# Patient Record
Sex: Male | Born: 2000 | Race: Black or African American | Hispanic: No | Marital: Single | Smoking: Current every day smoker
Health system: Southern US, Community
[De-identification: ages and names within clinical notes are randomized; demographics above are authoritative.]

---

## 2020-10-04 ENCOUNTER — Emergency Department: Admission: EM | Admit: 2020-10-04 | Discharge: 2020-10-04 | Discharge status: Against Medical Advice | Payer: Self-pay

## 2020-10-04 NOTE — ED Notes (Signed)
Called 1x with no answer

## 2020-10-11 ENCOUNTER — Other Ambulatory Visit: Payer: Self-pay

## 2020-10-11 ENCOUNTER — Emergency Department: Payer: PRIVATE HEALTH INSURANCE

## 2020-10-11 ENCOUNTER — Encounter: Payer: Self-pay | Admitting: Emergency Medicine

## 2020-10-11 DIAGNOSIS — W228XXA Striking against or struck by other objects, initial encounter: Secondary | ICD-10-CM | POA: Insufficient documentation

## 2020-10-11 DIAGNOSIS — S60551A Superficial foreign body of right hand, initial encounter: Secondary | ICD-10-CM | POA: Diagnosis not present

## 2020-10-11 DIAGNOSIS — S6991XA Unspecified injury of right wrist, hand and finger(s), initial encounter: Secondary | ICD-10-CM | POA: Diagnosis present
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
## 2020-10-12 ENCOUNTER — Emergency Department
Admission: EM | Admit: 2020-10-12 | Discharge: 2020-10-12 | Disposition: A | Discharge status: Home / Self Care | Payer: PRIVATE HEALTH INSURANCE | Attending: Emergency Medicine | Admitting: Emergency Medicine

## 2020-10-12 ENCOUNTER — Emergency Department: Payer: PRIVATE HEALTH INSURANCE

## 2020-10-12 DIAGNOSIS — M79641 Pain in right hand: Secondary | ICD-10-CM

## 2020-10-12 MED ORDER — LIDOCAINE-EPINEPHRINE 2 %-1:100000 IJ SOLN
20.0000 mL | Freq: Once | INTRAMUSCULAR | Status: AC
  Administered 2020-10-12: 20 mL via INTRADERMAL
  Filled 2020-10-12: qty 1

## 2020-10-12 MED ORDER — AMOXICILLIN-POT CLAVULANATE 875-125 MG PO TABS: 1.0000 | ORAL_TABLET | Freq: Two times a day (BID) | ORAL | 0 refills | Status: AC

## 2020-10-12 NOTE — ED Notes (Signed)
Pt alert and oriented X 4, stable for discharge. RR even and unlabored, color WNL. Discussed discharge instructions and follow-up as directed. Discharge medications discussed if prescribed. Pt had opportunity to ask questions, and RN to provide patient/family eduction.  

## 2020-10-12 NOTE — ED Provider Notes (Signed)
[facility] Emergency Department Provider Note  ____________________________________________   None    (approximate)  I have reviewed the triage vital signs and the nursing notes.   HISTORY  Chief Complaint Hand Injury   HPI Garrett Evans is a 20 y.o. male without significant past medical history who presents for assessment of some pain redness and a little bit of bleeding from a laceration he sustained to the back of his right hand at the base of the fifth digit 3 days ago when he excellently punched a window.  Patient denies any other injuries and states he wants to be checked out because he noticed some blood coming from his cut.  He has not had any spreading of redness or increased swelling or pain.  He denies any other injuries including in his forearm or other extremities.  He is otherwise been in his usual state of health without any recent other sick symptoms including fevers, chills, cough, nausea, vomiting, diarrhea, dysuria, rash or other acute sick symptoms.  He states his last tetanus shot was within the past year.         History reviewed. No pertinent past medical history.  There are no problems to display for this patient.   History reviewed. No pertinent surgical history.  Prior to Admission medications   Medication Sig Start Date End Date Taking? Authorizing Provider  amoxicillin-clavulanate (AUGMENTIN) 875-125 MG tablet Take 1 tablet by mouth 2 (two) times daily for 7 days. 10/12/20 10/19/20 Yes Gilles Chiquito, MD    Allergies Patient has no known allergies.  No family history on file.  Social History    Review of Systems  Review of Systems  Constitutional: Negative for chills and fever.  HENT: Negative for sore throat.   Eyes: Negative for pain.  Respiratory: Negative for cough and stridor.   Cardiovascular: Negative for chest pain.  Gastrointestinal: Negative for vomiting.  Genitourinary: Negative for dysuria.   Musculoskeletal: Positive for myalgias ( R hand).  Skin: Negative for rash.  Neurological: Negative for seizures, loss of consciousness and headaches.  Psychiatric/Behavioral: Negative for suicidal ideas.  All other systems reviewed and are negative.     ____________________________________________   PHYSICAL EXAM:  VITAL SIGNS: ED Triage Vitals  Enc Vitals Group     BP 10/11/20 2340 127/67     Pulse Rate 10/11/20 2340 60     Resp 10/11/20 2340 16     Temp 10/11/20 2340 98 F (36.7 C)     Temp Source 10/11/20 2340 Oral     SpO2 10/11/20 2340 99 %     Weight 10/11/20 2307 190 lb (86.2 kg)     Height 10/11/20 2307 5\' 11"  (1.803 m)     Head Circumference --      Peak Flow --      Pain Score 10/11/20 2307 10     Pain Loc --      Pain Edu? --      Excl. in GC? --    Vitals:   10/11/20 2340  BP: 127/67  Pulse: 60  Resp: 16  Temp: 98 F (36.7 C)  SpO2: 99%   Physical Exam Vitals and nursing note reviewed.  Constitutional:      Appearance: He is well-developed and well-nourished.  HENT:     Head: Normocephalic and atraumatic.     Right Ear: External ear normal.     Left Ear: External ear normal.     Nose: Nose normal.  Eyes:  Conjunctiva/sclera: Conjunctivae normal.  Cardiovascular:     Rate and Rhythm: Normal rate and regular rhythm.     Heart sounds: No murmur heard.   Pulmonary:     Effort: Pulmonary effort is normal. No respiratory distress.     Breath sounds: Normal breath sounds.  Abdominal:     Palpations: Abdomen is soft.     Tenderness: There is no abdominal tenderness.  Musculoskeletal:        General: No edema.     Cervical back: Neck supple.  Skin:    General: Skin is warm and dry.     Capillary Refill: Capillary refill takes less than 2 seconds.  Neurological:     Mental Status: He is alert and oriented to person, place, and time.  Psychiatric:        Mood and Affect: Mood and affect and mood normal.     2+ radial pulse.  Sensation  is intact in the distribution of the ulnar radial and median nerves in the right hand.  Patient is able to flex and extend all digits of the right hand with full strength.  He does have a scab over a approximately 2 cm linear laceration over the dorsum of the right hand at the base of the fifth digit.  There is also a small abrasion over the ulnar aspect of the wrist without any significant tenderness.  No snuffbox tenderness. ____________________________________________    ____________________________________________  RADIOLOGY  ED MD interpretation: No acute fracture or dislocation.  Retained glass fragment noted at the base of the right fifth metacarpal.  Official radiology report(s): DG Hand 2 View Right  Result Date: 10/12/2020 CLINICAL DATA:  Status post foreign body retrieval EXAM: RIGHT HAND - 2 VIEW COMPARISON:  10/11/2020 FINDINGS: Since the prior examination, there has been no change. Two radiopaque angular foreign bodies are seen within the soft tissues dorsal lateral to the right fifth metacarpophalangeal joint in keeping with retained radiopaque foreign bodies. No other changes are identified. IMPRESSION: Stable retained radiopaque foreign bodies within the soft tissues of the dorsal lateral right hand. Electronically Signed   By: Fidela Salisbury MD   On: 10/12/2020 05:03   DG Hand Complete Right  Result Date: 10/11/2020 CLINICAL DATA:  Punched through glass.  Right hand laceration EXAM: RIGHT HAND - COMPLETE 3+ VIEW COMPARISON:  None. FINDINGS: z three view radiograph right hand demonstrates no acute fracture or dislocationl. Normal alignment. There are two radiopaque foreign bodies within the a soft tissues dorsal lateral to the right fifth MCP joint compatible with retained fragments of glass given the history. These measure 5 mm and 2 mm in greatest dimension. Mild associated soft tissue swelling is present. IMPRESSION: Two retained radiopaque foreign bodies compatible with glass  fragments measuring 2 mm and 5 mm dorsal lateral to the right fifth MCP. Electronically Signed   By: Fidela Salisbury MD   On: 10/11/2020 23:26    ____________________________________________   PROCEDURES  Procedure(s) performed (including Critical Care):  .Foreign Body Removal  Date/Time: 10/12/2020 5:25 AM Performed by: Lucrezia Starch, MD Authorized by: Lucrezia Starch, MD  Consent: Verbal consent obtained. Consent given by: patient Patient understanding: patient states understanding of the procedure being performed Patient identity confirmed: verbally with patient Body area: skin General location: upper extremity Location details: right hand Anesthesia: local infiltration  Anesthesia: Local Anesthetic: lidocaine 1% with epinephrine Anesthetic total: 3 mL  Sedation: Patient sedated: no  Patient restrained: no Tendon involvement: none Complexity: simple 1  objects recovered. Patient tolerance: patient tolerated the procedure well with no immediate complications     ____________________________________________   INITIAL IMPRESSION / ASSESSMENT AND PLAN / ED COURSE      Patient presents for assessment of laceration to the back of his right hand he sustained after he punched a window 3 days ago.  On arrival he is afebrile hemodynamically stable.  He is neurovascular intact throughout his right hand.  No fracture dislocation on x-ray but there is retained glass fragment.  No snuffbox tenderness to suggest an occult scaphoid injury.  Tetanus is already up-to-date.  Wound was extensively irrigated and explored throughout its full range initially.  Repeat x-ray obtained as I was initially unsure if glass had come out during irrigation but showed retained glass fragment.  This was removed and I was able to extract a jagged shaped fragment which maxed fragment and repeat x-ray.  No other foreign body seen on x-ray and of low suspicion for non-radiopaque foreign bodies.  No  evidence of cellulitis.  However wound is very dirty and was explored fairly deep so we will write short course of antibiotics to decrease risk of infection.  Patient discharged stable condition.  Strict return precautions advised and discussed.          ____________________________________________   FINAL CLINICAL IMPRESSION(S) / ED DIAGNOSES  Final diagnoses:  Right hand pain    Medications  lidocaine-EPINEPHrine (XYLOCAINE W/EPI) 2 %-1:100000 (with pres) injection 20 mL (20 mLs Intradermal Given by Other 10/12/20 9629)     ED Discharge Orders         Ordered    amoxicillin-clavulanate (AUGMENTIN) 875-125 MG tablet  2 times daily        10/12/20 0521           Note:  This document was prepared using Dragon voice recognition software and may include unintentional dictation errors.   Gilles Chiquito, MD 10/12/20 [phone]

## 2020-11-04 ENCOUNTER — Other Ambulatory Visit: Payer: Self-pay

## 2020-11-04 ENCOUNTER — Encounter: Payer: Self-pay | Admitting: Emergency Medicine

## 2020-11-04 ENCOUNTER — Emergency Department
Admission: EM | Admit: 2020-11-04 | Discharge: 2020-11-04 | Disposition: A | Discharge status: Home / Self Care | Payer: PRIVATE HEALTH INSURANCE | Attending: Emergency Medicine | Admitting: Emergency Medicine

## 2020-11-04 DIAGNOSIS — R21 Rash and other nonspecific skin eruption: Secondary | ICD-10-CM | POA: Diagnosis not present

## 2020-11-04 DIAGNOSIS — N4829 Other inflammatory disorders of penis: Secondary | ICD-10-CM | POA: Insufficient documentation

## 2020-11-04 DIAGNOSIS — Z79899 Other long term (current) drug therapy: Secondary | ICD-10-CM | POA: Insufficient documentation

## 2020-11-04 DIAGNOSIS — F17210 Nicotine dependence, cigarettes, uncomplicated: Secondary | ICD-10-CM | POA: Insufficient documentation

## 2020-11-04 MED ORDER — TRIAMCINOLONE ACETONIDE 0.1 % EX CREA: TOPICAL_CREAM | CUTANEOUS | 0 refills | Status: AC

## 2020-11-04 NOTE — ED Provider Notes (Signed)
[facility] Emergency Department Provider Note ____________________________________________   Event Date/Time   First MD Initiated Contact with Patient 11/04/20 [phone]     (approximate)  I have reviewed the triage vital signs and the nursing notes.   HISTORY  Chief Complaint Other  HPI Garrett Evans is a 20 y.o. male presents to the ED with complaint of bumps on his penis that was noticed last night while he was in the bathroom.  Patient denies any penile discharge, no pain, no itching, and no redness.  Patient has 1 partner and does not use condoms.  He denies any urinary symptoms.  Currently rates pain as 0/10.       History reviewed. No pertinent past medical history.  There are no problems to display for this patient.   History reviewed. No pertinent surgical history.  Prior to Admission medications   Medication Sig Start Date End Date Taking? Authorizing Provider  triamcinolone (KENALOG) 0.1 % Apply very thin coat of cream to bumps twice a day 11/04/20  Yes Tommi Rumps, PA-C    Allergies Patient has no known allergies.  No family history on file.  Social History Social History   Tobacco Use  . Smoking status: Current Every Day Smoker  . Smokeless tobacco: Never Used  Substance Use Topics  . Alcohol use: Never  . Drug use: Never    Review of Systems Constitutional: No fever/chills Eyes: No visual changes. Cardiovascular: Denies chest pain. Respiratory: Denies shortness of breath. Gastrointestinal:  No nausea, no vomiting.  Genitourinary: Negative for dysuria.  Negative for  penile discharge Musculoskeletal: Negative for back pain. Skin: Positive for lesions on penis. Neurological: Negative for headaches, focal weakness or numbness. ____________________________________________   PHYSICAL EXAM:  VITAL SIGNS: ED Triage Vitals  Enc Vitals Group     BP 11/04/20 0405 127/77     Pulse Rate 11/04/20 0405 62     Resp  11/04/20 0405 20     Temp 11/04/20 0405 98 F (36.7 C)     Temp Source 11/04/20 0405 Oral     SpO2 11/04/20 0405 100 %     Weight 11/04/20 0412 190 lb (86.2 kg)     Height 11/04/20 0412 5\' 11"  (1.803 m)     Head Circumference --      Peak Flow --      Pain Score 11/04/20 0412 0     Pain Loc --      Pain Edu? --      Excl. in GC? --     Constitutional: Alert and oriented. Well appearing and in no acute distress. Eyes: Conjunctivae are normal.  Head: Atraumatic. Neck: No stridor.   Cardiovascular: Normal rate, regular rhythm. Grossly normal heart sounds.  Good peripheral circulation. Respiratory: Normal respiratory effort.  No retractions. Lungs CTAB. Gastrointestinal: Soft and nontender. No distention. No abdominal bruits. No CVA tenderness. Musculoskeletal: No lower extremity tenderness nor edema.  No joint effusions. Neurologic:  Normal speech and language. No gross focal neurologic deficits are appreciated. No gait instability. Skin:  Skin is warm, dry and intact.  On the shaft of the penis there are 3 papules.  No involvement of the glans.  These are papules and not vesicles.  There is no cluster or erythema at the base.  Area is nontender to touch.  No penile discharge is noted. Psychiatric: Mood and affect are normal. Speech and behavior are normal.  ____________________________________________   LABS (all labs ordered are listed, but only  abnormal results are displayed)  Labs Reviewed - No data to display   PROCEDURES  Procedure(s) performed (including Critical Care):  Procedures   ____________________________________________   INITIAL IMPRESSION / ASSESSMENT AND PLAN / ED COURSE  As part of my medical decision making, I reviewed the following data within the electronic MEDICAL RECORD NUMBER Notes from prior ED visits and [place] Controlled Substance Database  20 year old male presents to the ED with complaint of bumps on his penis that he noticed when going to the  bathroom during the night.  Patient denies any pain or penile discharge.  Patient does not wear condoms.  He currently has 1 partner.  Area does not appear erythematous or with vesicles.  There are 3 papules noted on the shaft that are nontender.  Patient was made aware that most likely this is not herpes.  He is to watch these areas and if any further testing is needed he is encouraged for he and his partner to go to the health department.  Areas do not appear to be venereal warts at this time as well.  ____________________________________________   FINAL CLINICAL IMPRESSION(S) / ED DIAGNOSES  Final diagnoses:  Rash/skin eruption     ED Discharge Orders         Ordered    triamcinolone (KENALOG) 0.1 %        11/04/20 0803          *Please note:  Garrett Evans was evaluated in Emergency Department on 11/04/2020 for the symptoms described in the history of present illness. He was evaluated in the context of the global COVID-19 pandemic, which necessitated consideration that the patient might be at risk for infection with the SARS-CoV-2 virus that causes COVID-19. Institutional protocols and algorithms that pertain to the evaluation of patients at risk for COVID-19 are in a state of rapid change based on information released by regulatory bodies including the CDC and federal and state organizations. These policies and algorithms were followed during the patient's care in the ED.  Some ED evaluations and interventions may be delayed as a result of limited staffing during and the pandemic.*   Note:  This document was prepared using Dragon voice recognition software and may include unintentional dictation errors.   Tommi Rumps, PA-C 11/04/20 1630    Gilles Chiquito, MD 11/04/20 2037

## 2020-11-04 NOTE — ED Notes (Signed)
Pt verbalizes understanding of d/c instructions, medications and follow up 

## 2020-11-04 NOTE — Discharge Instructions (Signed)
Follow up with Health Department if any continued problems.  Use a very thin application of cream to the area twice a day.  Read information about herpes and watch for any continued break outs. You and your partner can be screened and treated for free if needed.

## 2020-11-04 NOTE — ED Triage Notes (Signed)
Patient states that when he went to the bathroom he noticed that he has bumps on his penis.

## 2020-12-27 ENCOUNTER — Other Ambulatory Visit: Payer: Self-pay

## 2020-12-27 ENCOUNTER — Emergency Department
Admission: EM | Admit: 2020-12-27 | Discharge: 2020-12-27 | Disposition: A | Discharge status: Home / Self Care | Payer: PRIVATE HEALTH INSURANCE | Attending: Emergency Medicine | Admitting: Emergency Medicine

## 2020-12-27 ENCOUNTER — Emergency Department: Payer: PRIVATE HEALTH INSURANCE

## 2020-12-27 DIAGNOSIS — S20212A Contusion of left front wall of thorax, initial encounter: Secondary | ICD-10-CM | POA: Insufficient documentation

## 2020-12-27 DIAGNOSIS — S299XXA Unspecified injury of thorax, initial encounter: Secondary | ICD-10-CM | POA: Diagnosis present

## 2020-12-27 DIAGNOSIS — Y92410 Unspecified street and highway as the place of occurrence of the external cause: Secondary | ICD-10-CM | POA: Diagnosis not present

## 2020-12-27 DIAGNOSIS — F17200 Nicotine dependence, unspecified, uncomplicated: Secondary | ICD-10-CM | POA: Insufficient documentation

## 2020-12-27 MED ORDER — NAPROXEN 500 MG PO TABS: 500.0000 mg | ORAL_TABLET | Freq: Two times a day (BID) | ORAL | Status: AC

## 2020-12-27 MED ORDER — ORPHENADRINE CITRATE ER 100 MG PO TB12: 100.0000 mg | ORAL_TABLET | Freq: Two times a day (BID) | ORAL | 0 refills | Status: AC

## 2020-12-27 NOTE — Discharge Instructions (Signed)
Your x-ray did not show any obvious fracture of the left lateral ribs.  Follow discharge care instructions and take medication as directed.  Be advised muscle relaxers may cause drowsiness.
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
## 2021-01-03 ENCOUNTER — Telehealth: Payer: Self-pay

## 2021-01-03 NOTE — Telephone Encounter (Signed)
After receiving an ER referral, called pt. He is not interested in becoming a pt here at this time.

## 2021-11-12 ENCOUNTER — Encounter: Payer: Self-pay | Admitting: Emergency Medicine

## 2021-11-12 ENCOUNTER — Other Ambulatory Visit: Payer: Self-pay

## 2021-11-12 ENCOUNTER — Emergency Department: Payer: PRIVATE HEALTH INSURANCE

## 2021-11-12 ENCOUNTER — Emergency Department
Admission: EM | Admit: 2021-11-12 | Discharge: 2021-11-12 | Disposition: A | Discharge status: Home / Self Care | Payer: PRIVATE HEALTH INSURANCE | Attending: Emergency Medicine | Admitting: Emergency Medicine

## 2021-11-12 DIAGNOSIS — R109 Unspecified abdominal pain: Secondary | ICD-10-CM | POA: Diagnosis not present

## 2021-11-12 DIAGNOSIS — R10A0 Flank pain, unspecified side: Secondary | ICD-10-CM

## 2021-11-12 DIAGNOSIS — M545 Low back pain, unspecified: Secondary | ICD-10-CM | POA: Diagnosis not present

## 2021-11-12 DIAGNOSIS — R319 Hematuria, unspecified: Secondary | ICD-10-CM

## 2021-11-12 LAB — CHLAMYDIA/NGC RT PCR (ARMC ONLY)
Chlamydia Tr: NOT DETECTED
N gonorrhoeae: NOT DETECTED

## 2021-11-12 LAB — URINALYSIS, ROUTINE W REFLEX MICROSCOPIC
Bacteria, UA: NONE SEEN
Bilirubin Urine: NEGATIVE
Glucose, UA: NEGATIVE mg/dL
Ketones, ur: NEGATIVE mg/dL
Leukocytes,Ua: NEGATIVE
Nitrite: NEGATIVE
Protein, ur: NEGATIVE mg/dL
Specific Gravity, Urine: 1.016 (ref 1.005–1.030)
Squamous Epithelial / HPF: NONE SEEN (ref 0–5)
pH: 7 (ref 5.0–8.0)

## 2021-11-12 LAB — BASIC METABOLIC PANEL
Anion gap: 7 (ref 5–15)
BUN: 9 mg/dL (ref 6–20)
CO2: 26 mmol/L (ref 22–32)
Calcium: 9.4 mg/dL (ref 8.9–10.3)
Chloride: 104 mmol/L (ref 98–111)
Creatinine, Ser: 0.81 mg/dL (ref 0.61–1.24)
GFR, Estimated: >60 mL/min (ref >60)
Glucose, Bld: 96 mg/dL (ref 70–99)
Potassium: 4.2 mmol/L (ref 3.5–5.1)
Sodium: 137 mmol/L (ref 135–145)

## 2021-11-12 LAB — CBC
HCT: 41.6 % (ref 39.0–52.0)
Hemoglobin: 13.9 g/dL (ref 13.0–17.0)
MCH: 27.7 pg (ref 26.0–34.0)
MCHC: 33.4 g/dL (ref 30.0–36.0)
MCV: 83 fL (ref 80.0–100.0)
Platelets: 204 10*3/uL (ref 150–400)
RBC: 5.01 MIL/uL (ref 4.22–5.81)
RDW: 12.9 % (ref 11.5–15.5)
WBC: 5.8 10*3/uL (ref 4.0–10.5)
nRBC: 0 % (ref 0.0–0.2)

## 2021-11-12 LAB — LIPASE, BLOOD: Lipase: 31 U/L (ref 11–51)

## 2021-11-12 MED ORDER — KETOROLAC TROMETHAMINE 30 MG/ML IJ SOLN
15.0000 mg | Freq: Once | INTRAMUSCULAR | Status: AC
  Administered 2021-11-12: 15 mg via INTRAVENOUS
  Filled 2021-11-12: qty 1

## 2021-11-12 MED ORDER — LACTATED RINGERS IV BOLUS
1000.0000 mL | Freq: Once | INTRAVENOUS | Status: AC
Start: 2021-11-12 — End: 2021-11-12
  Administered 2021-11-12: 1000 mL via INTRAVENOUS

## 2021-11-12 MED ORDER — ACETAMINOPHEN 500 MG PO TABS
1000.0000 mg | ORAL_TABLET | Freq: Once | ORAL | Status: AC
  Administered 2021-11-12: 1000 mg via ORAL
  Filled 2021-11-12: qty 2

## 2021-11-12 NOTE — ED Triage Notes (Signed)
Pt comes into the ED via POV c/o left flank pain and blood in his urine.  Pt states he didn't notice it until this morning.  Pt denies any h/o kidney stones.  Pt in NAd at this time with even and unlabored respirations.

## 2021-11-12 NOTE — ED Provider Notes (Signed)
[facility] Provider Note    Event Date/Time   First MD Initiated Contact with Patient 11/12/21 [phone]     (approximate)   History   Flank Pain   HPI  Garrett Evans is a 21 y.o. male without significant past medical history who presents for assessment of some left-sided low back pain rating around the left flank associate with gross hematuria that started this morning.  He states the pain woke him from sleep.  He states it was a 6 out of 10 when it began but is now 4 out of 10 intensity.  He denies any burning with urination or current abdominal pain.  Denies any fevers, vomiting, diarrhea, nausea, chest pain, cough, shortness of breath, headache, earache, sore throat, rash or extremity pain.  Denies tobacco use or illicit drug use.  He is not typically daily drinker but recently was out of town for couple days drink excessively over the last couple days.  He has no other acute concerns at this time      Physical Exam  Triage Vital Signs: ED Triage Vitals  Enc Vitals Group     BP 11/12/21 0858 133/86     Pulse Rate 11/12/21 0858 66     Resp 11/12/21 0858 18     Temp 11/12/21 0858 98.9 F (37.2 C)     Temp src --      SpO2 11/12/21 0858 97 %     Weight 11/12/21 0854 190 lb 0.6 oz (86.2 kg)     Height 11/12/21 0854 5\' 11"  (1.803 m)     Head Circumference --      Peak Flow --      Pain Score 11/12/21 0854 6     Pain Loc --      Pain Edu? --      Excl. in [place]? --     Most recent vital signs: Vitals:   11/12/21 0858  BP: 133/86  Pulse: 66  Resp: 18  Temp: 98.9 F (37.2 C)  SpO2: 97%    General: Awake, no distress.  CV:  Good peripheral perfusion.  Resp:  Normal effort.  Abd:  No distention.  Other:  No CVA tenderness overlying skin changes over the back.   ED Results / Procedures / Treatments  Labs (all labs ordered are listed, but only abnormal results are displayed) Labs Reviewed  URINALYSIS, ROUTINE W REFLEX MICROSCOPIC - Abnormal;  Notable for the following components:      Result Value   Color, Urine YELLOW (*)    APPearance CLEAR (*)    Hgb urine dipstick SMALL (*)    All other components within normal limits  CHLAMYDIA/NGC RT PCR ([facility] ONLY)            BASIC METABOLIC PANEL  CBC  LIPASE, BLOOD     EKG    RADIOLOGY  CT abdomen pelvis interpreted by myself shows no evidence of perinephric stranding or kidney stone or ureteral dilation.  No other clear acute process.  Also reviewed radiology interpretation and agree with her findings of no acute abdominal pelvic process.  Specifically no evidence of diverticulitis, kidney stone, pancreatitis, or pyelonephritis.   PROCEDURES:  Critical Care performed: No  Procedures    MEDICATIONS ORDERED IN ED: Medications  lactated ringers bolus 1,000 mL (1,000 mLs Intravenous New Bag/Given 11/12/21 0922)  acetaminophen (TYLENOL) tablet 1,000 mg (1,000 mg Oral Given 11/12/21 0918)  ketorolac (TORADOL) 30 MG/ML injection 15 mg (15 mg Intravenous Given  11/12/21 0919)     IMPRESSION / MDM / ASSESSMENT AND PLAN / ED COURSE  I reviewed the triage vital signs and the nursing notes.                              Differential diagnosis includes, but is not limited to kidney stone, pancreatitis, diverticulitis, pyelonephritis, gastritis and MSK.   CT abdomen pelvis interpreted by myself shows no evidence of perinephric stranding or kidney stone or ureteral dilation.  No other clear acute process.  Also reviewed radiology interpretation and agree with her findings of no acute abdominal pelvic process.  Specifically no evidence of diverticulitis, kidney stone, pancreatitis, or pyelonephritis.    UA has some hemoglobin but otherwise no evidence of infection.  BMP shows no significant lecture metabolic derangements.  Kidney function is within normal limits.  CT shows no leukocytosis or acute anemia.  Lipase not consistent with acute pancreatitis.  GC and gonorrhea studies sent to  labor low suspicion for this at this time.  I suspect possibly small passed down patient did not see versus possible radiolucent stone.  Patient's pain is well controlled on my reassessment after he received some Toradol and Tylenol.  Do not believe fevers prescribe stronger prescription medications at this time and he has no GI symptoms.  Advised continued soft on ibuprofen as needed and close follow-up with PCP.  He will follow-up with gonorrhea committee studies with PCP as well.  Discharged in stable condition.  Strict precautions advised and discussed.     FINAL CLINICAL IMPRESSION(S) / ED DIAGNOSES   Final diagnoses:  Flank pain  Hematuria, unspecified type     Rx / DC Orders   ED Discharge Orders     None        Note:  This document was prepared using Dragon voice recognition software and may include unintentional dictation errors.   Lucrezia Starch, MD 11/12/21 [phone]

## 2022-05-23 IMAGING — CT CT RENAL STONE PROTOCOL
2 of 4 series · 16 of 46 positions shown, 18 images · non-contrast
Comparison: None.

CLINICAL DATA: Nephrolithiasis, left flank pain



[Series 2: stone full standard · axial · 0.82mm/px · z∈[-1104,-679]mm · 13 of 93 slices shown, 15 images]
[im 4/93  soft-tissue]
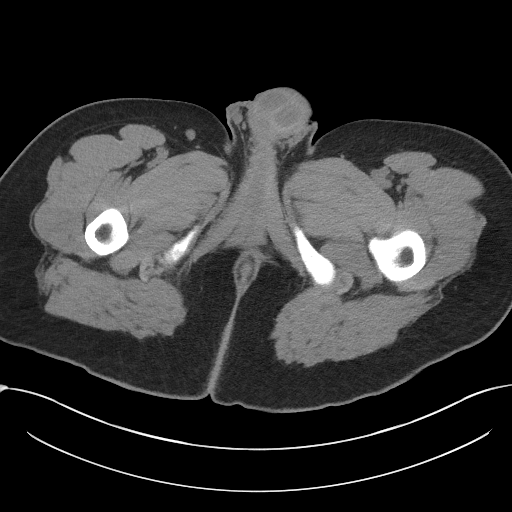
[im 4/93  bone]
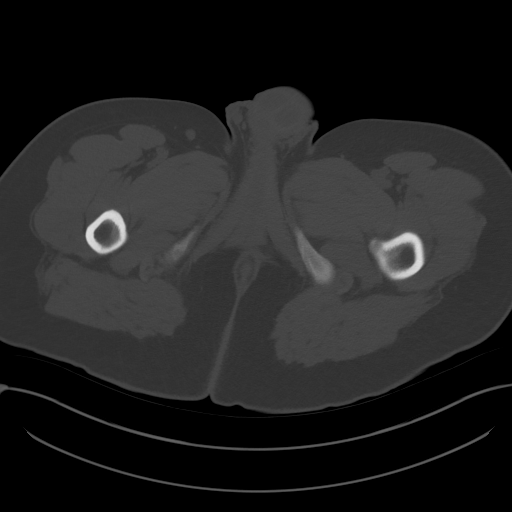
[im 12/93  soft-tissue]
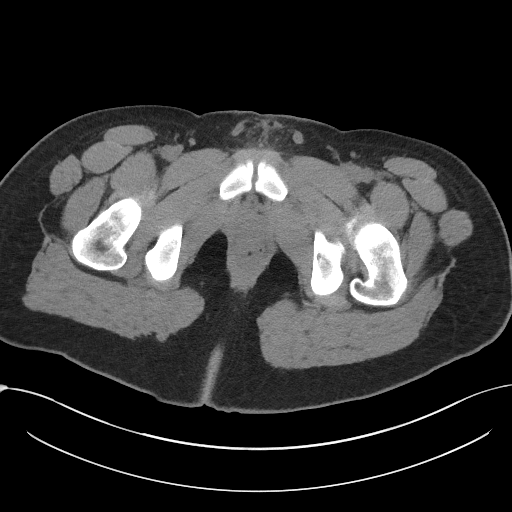
[im 19/93  soft-tissue]
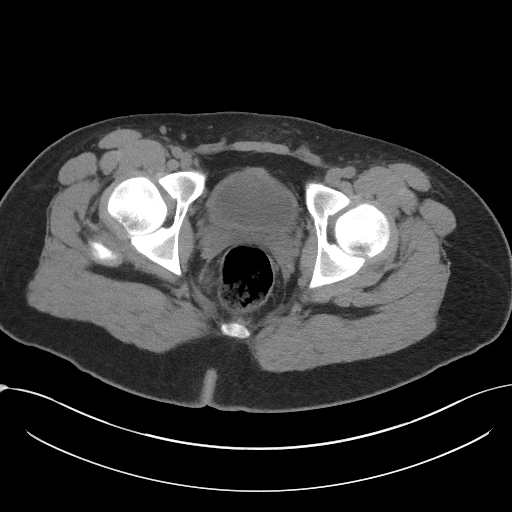
[im 26/93  soft-tissue]
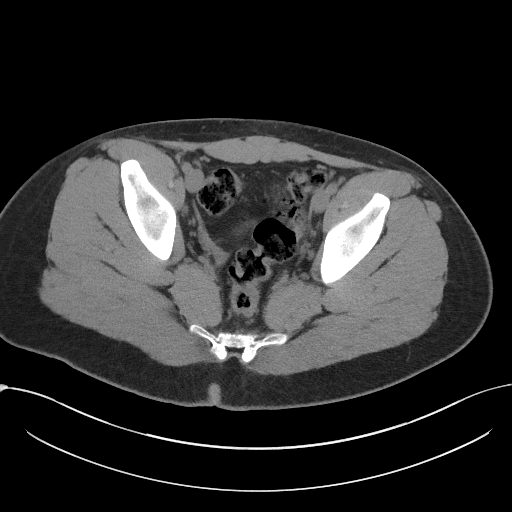
[im 34/93  soft-tissue]
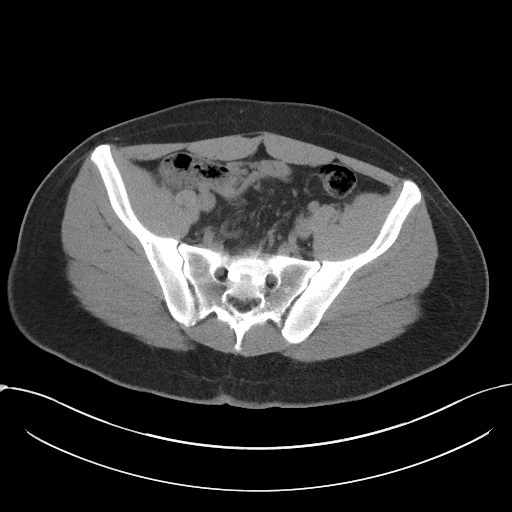
[im 41/93  soft-tissue]
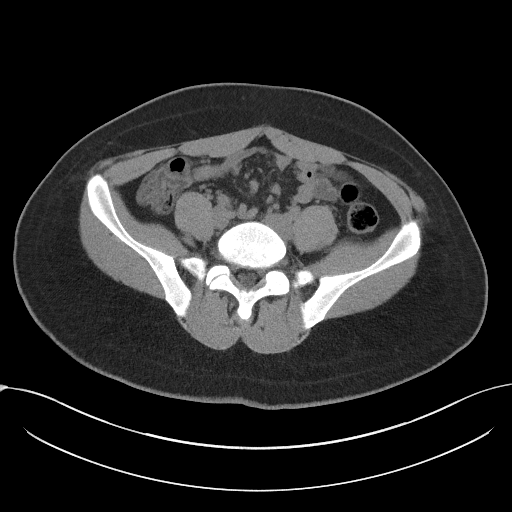
[im 48/93  soft-tissue]
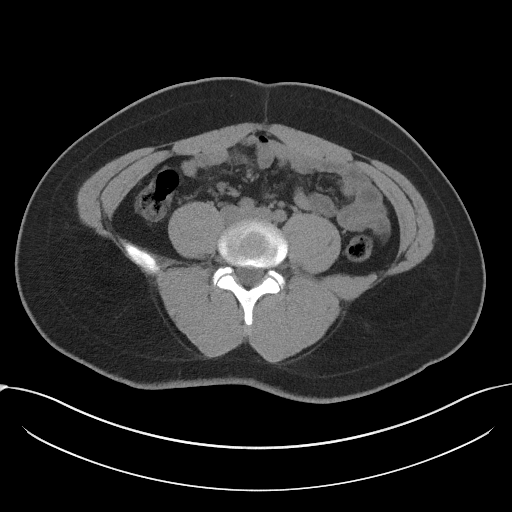
[im 52/93  soft-tissue]
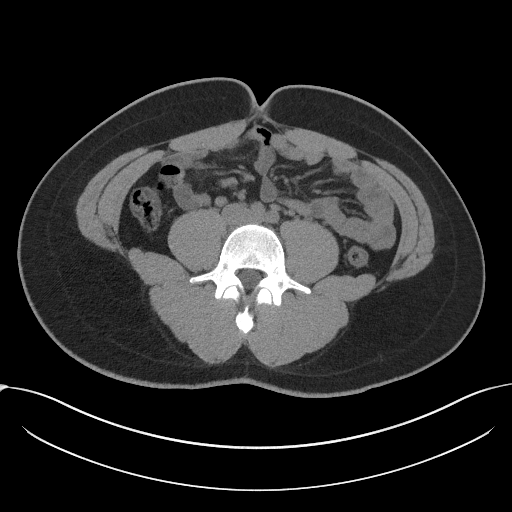
[im 59/93  soft-tissue]
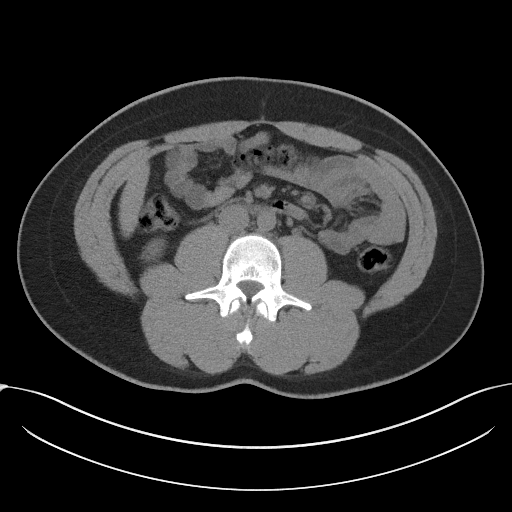
[im 59/93  bone]
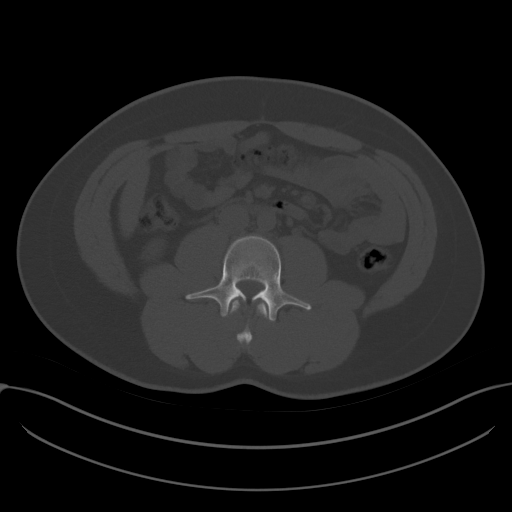
[im 67/93  soft-tissue]
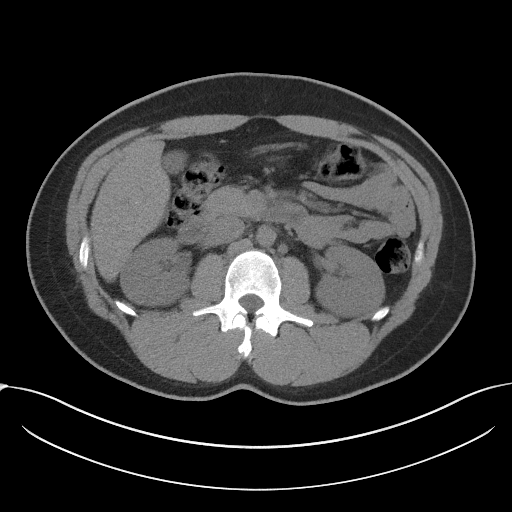
[im 74/93  soft-tissue]
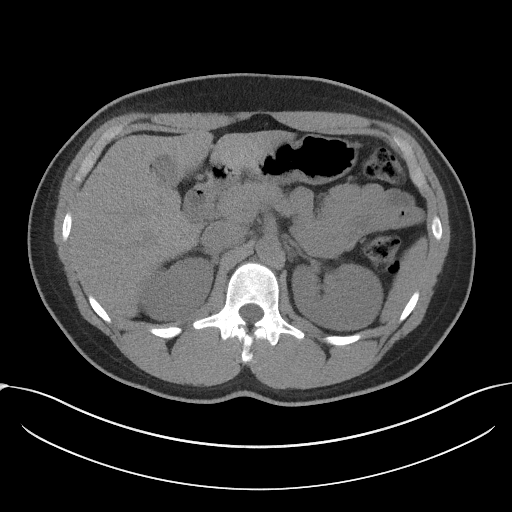
[im 81/93  soft-tissue]
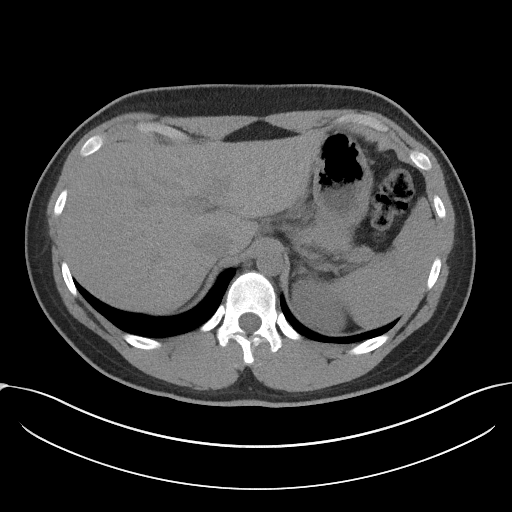
[im 89/93  soft-tissue]
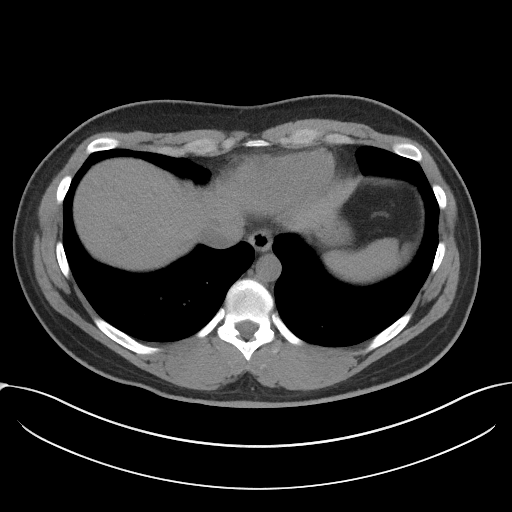

[Series 5: coronal · coronal · 0.77mm/px · 3 of 142 slices shown]
[im 48/142  soft-tissue]
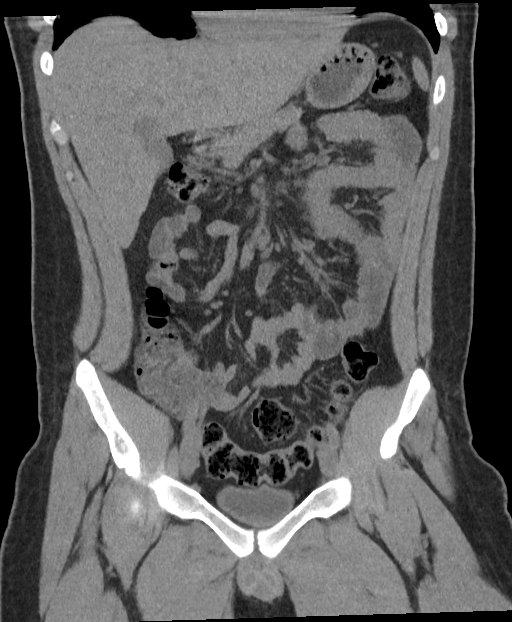
[im 63/142  soft-tissue]
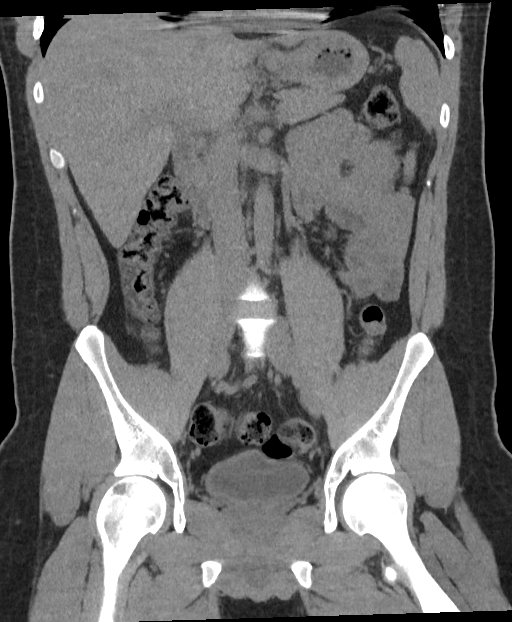
[im 79/142  soft-tissue]
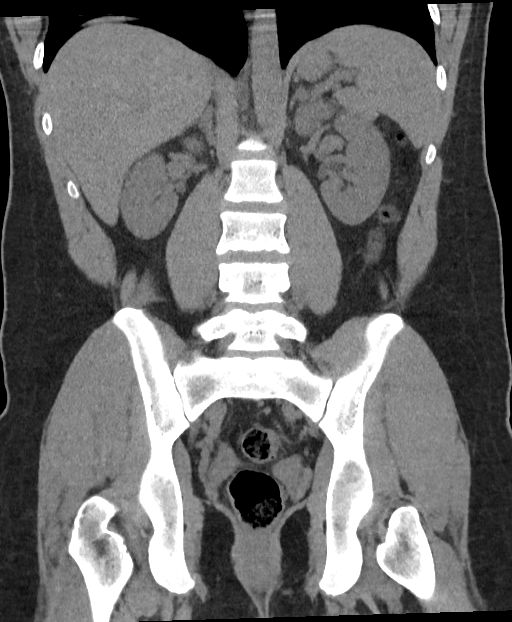

[16 of 46 positions shown; findings below may reference images not displayed]

FINDINGS: Lower chest: No acute abnormality.

Hepatobiliary: No focal liver abnormality is seen. No gallstones,
gallbladder wall thickening, or biliary dilatation.

Pancreas: Unremarkable. No pancreatic ductal dilatation or
surrounding inflammatory changes.

Spleen: Normal in size without focal abnormality.

Adrenals/Urinary Tract: Adrenal glands are unremarkable. Kidneys are
normal, without renal calculi, focal lesion, or hydronephrosis.
Bladder is unremarkable.

Stomach/Bowel: Stomach is within normal limits. No evidence of bowel
wall thickening, distention, or inflammatory changes. Appendix is
normal.

Vascular/Lymphatic: No significant vascular findings are present. No
enlarged abdominal or pelvic lymph nodes.

Reproductive: Prostate is unremarkable.

Other: No abdominal wall hernia or abnormality. No abdominopelvic
ascites.

Musculoskeletal: No acute osseous abnormality. No aggressive osseous
lesion.
IMPRESSION: 1. No acute abdominal or pelvic pathology.

## 2023-06-19 ENCOUNTER — Other Ambulatory Visit: Payer: Self-pay

## 2023-06-19 ENCOUNTER — Encounter: Payer: Self-pay | Admitting: Emergency Medicine

## 2023-06-19 ENCOUNTER — Emergency Department
Admission: EM | Admit: 2023-06-19 | Discharge: 2023-06-19 | Disposition: A | Discharge status: Home / Self Care | Payer: PRIVATE HEALTH INSURANCE | Attending: Emergency Medicine | Admitting: Emergency Medicine

## 2023-06-19 DIAGNOSIS — X58XXXA Exposure to other specified factors, initial encounter: Secondary | ICD-10-CM | POA: Diagnosis not present

## 2023-06-19 DIAGNOSIS — S51812A Laceration without foreign body of left forearm, initial encounter: Secondary | ICD-10-CM | POA: Insufficient documentation

## 2023-06-19 DIAGNOSIS — S51819A Laceration without foreign body of unspecified forearm, initial encounter: Secondary | ICD-10-CM | POA: Insufficient documentation

## 2023-06-19 MED ORDER — LIDOCAINE HCL (PF) 1 % IJ SOLN
10.0000 mL | Freq: Once | INTRAMUSCULAR | Status: AC
Start: 2023-06-19 — End: 2023-06-19
  Administered 2023-06-19: 10 mL
  Filled 2023-06-19: qty 10

## 2023-06-19 NOTE — Consult Note (Signed)
Telepsych Consultation   Reason for Consult:  Psych Evaluation Referring Physician:  Dr.Dylan Location of Patient: [facility] ER Location of Provider: Other: Remote office  Patient Identification: Garrett Evans MRN:  [id number] Principal Diagnosis: Cut of forearm Diagnosis:  Principal Problem:   Cut of forearm   Total Time spent with patient: 30 minutes  Subjective:  " I came here because I accidentally cut myself"   HPI:  Tele psych Assessment   Garrett Evans, 22 y.o., male patient seen via tele health by TTS and this provider; chart reviewed and consulted with Dr. Katrinka Blazing on 06/19/23.  On evaluation Garrett Evans reports that he cut himself accidentally.  He states the scissors were lodged in the couch and when he jumped up, his skin "snagged" by the points of the scissors.  Per TTS, Garrett Evans is a 22 y.o., Black, Not Hispanic or Latino ethnicity, ENGLISH speaking male with no known psych hx. Pt presented to the ED voluntarily and was placed under IVC. Per triage note: "Patient ambulatory to triage with steady gait, without difficulty or distress; approx 2" laceration to left FA and also area of abrasion; no active bleeding; pt's "girlfriend" is speaking for pt; when ask how this occurred, girlfriend st "he cut himself with some scissors"; when pt asked for details, does not elaborate, just st "I don't know".   Pt was dressed in scrubs with an unremarkable appearance. Pt was cooperative and moderately anxious throughout the interview. Pt was alert and oriented x5. Pt's speech was clear and coherent. Pt's thought processes were relevant and linear. Pt had good reality testing. Motor behavior appeared normal. Eye contact was poor. Pt's mood was euthymic; affect was responsive. Patient was noted to have good insight, explaining that he'd accidently snagged his arm on open scissors and in no way wants to end his life or self-injure. Pt reported that he has a job and that his life is good  enough. The patient did admit to daily cannabis use. The patient denied symptoms of depression/anxiety. Pt reported that he does not have a hx of suicidal attempts, NSSIB, SI, or mental health hospitalizations. Pt also denied HI, and AV/H.    Collateral: [name] (Girlfriend) [phone]  Pt's girlfriend corroborated that pt's claims about accidently injuring himself. Girlfriend did not have any safety concerns for the pt, explaining that the pt does not have a hx of self-harm or depressed moods. Girlfriend explained that she would be available to pick pt up if discharged before 5 AM; otherwise she would have to pick pt up after 1pm 06/19/23.    Recommendations: Discharge after medical clearance   Dr. Katrinka Blazing informed of above recommendation and disposition  Past Psychiatric History: No none history  Risk to Self:   Risk to Others:   Prior Inpatient Therapy:   Prior Outpatient Therapy:    Past Medical History: History reviewed. No pertinent past medical history. History reviewed. No pertinent surgical history. Family History: History reviewed. No pertinent family history. Family Psychiatric  History: unnown Social History:  Social History   Substance and Sexual Activity  Alcohol Use Never     Social History   Substance and Sexual Activity  Drug Use Never    Social History   Socioeconomic History   Marital status: Single    Spouse name: Not on file   Number of children: Not on file   Years of education: Not on file   Highest education level: Not on file  Occupational History   Not on  file  Tobacco Use   Smoking status: Every Day   Smokeless tobacco: Never  Substance and Sexual Activity   Alcohol use: Never   Drug use: Never   Sexual activity: Yes    Birth control/protection: None  Other Topics Concern   Not on file  Social History Narrative   Not on file   Social Determinants of Health   Financial Resource Strain: Not on file  Food Insecurity: Not on file   Transportation Needs: Not on file  Physical Activity: Not on file  Stress: Not on file  Social Connections: Not on file   Additional Social History:    Allergies:  No Known Allergies  Labs: No results found for this or any previous visit (from the past 48 hour(s)).  Medications:  No current facility-administered medications for this encounter.   Current Outpatient Medications  Medication Sig Dispense Refill   naproxen (NAPROSYN) 500 MG tablet Take 1 tablet (500 mg total) by mouth 2 (two) times daily with a meal. 20 tablet 00   orphenadrine (NORFLEX) 100 MG tablet Take 1 tablet (100 mg total) by mouth 2 (two) times daily. 10 tablet 0   triamcinolone (KENALOG) 0.1 % Apply very thin coat of cream to bumps twice a day 15 g 0    Musculoskeletal: Strength & Muscle Tone: within normal limits Gait & Station: normal Patient leans: N/A  Psychiatric Specialty Exam:  Presentation  General Appearance: Appropriate for Environment  Eye Contact:Good  Speech:Clear and Coherent  Speech Volume:Normal  Handedness:Right   Mood and Affect  Mood:Euthymic  Affect:Congruent; Appropriate   Thought Process  Thought Processes:Coherent  Descriptions of Associations:Intact  Orientation:Full (Time, Place and Person)  Thought Content:Logical; WDL  History of Schizophrenia/Schizoaffective disorder:No data recorded Duration of Psychotic Symptoms:No data recorded Hallucinations:Hallucinations: None  Ideas of Reference:None  Suicidal Thoughts:Suicidal Thoughts: No  Homicidal Thoughts:Homicidal Thoughts: No   Sensorium  Memory:Immediate Good; Recent Good  Judgment:Good  Insight:Fair   Executive Functions  Concentration:Good  Attention Span:Good  Recall:Good  Fund of Knowledge:Good  Language:Good   Psychomotor Activity  Psychomotor Activity:Psychomotor Activity: Normal   Assets  Assets:Communication Skills; Desire for Improvement; Financial  Resources/Insurance   Sleep  Sleep:Sleep: Good    Physical Exam: Physical Exam Vitals and nursing note reviewed.    ROS Blood pressure 120/74, pulse 62, temperature 97.8 F (36.6 C), temperature source Oral, resp. rate 18, height 5\' 11"  (1.803 m), weight 86.2 kg, SpO2 100%. Body mass index is 26.5 kg/m.    Disposition: No evidence of imminent risk to self or others at present.   Patient does not meet criteria for psychiatric inpatient admission. Discussed crisis plan, support from social network, calling 911, coming to the Emergency Department, and calling Suicide Hotline.  This service was provided via telemedicine using a 2-way, interactive audio and video technology.   Jearld Lesch, NP 06/19/2023 3:46 AM

## 2023-06-19 NOTE — ED Triage Notes (Signed)
Patient ambulatory to triage with steady gait, without difficulty or distress; approx 2" laceration to left FA and also area of abrasion; no active bleeding; pt's "girlfriend" is speaking for pt; when ask how this occurred, girlfriend st "he cut himself with some scissors"; when pt asked for details, does not elaborate, just st "I don't know"

## 2023-06-19 NOTE — BH Assessment (Addendum)
Comprehensive Clinical Assessment (CCA) Screening, Triage and Referral Note  06/19/2023 Garrett Evans [id number] Recommendations for Services/Supports/Treatments: Psych NP Rashaun D. determined pt. does not meet psychiatric inpatient criteria and can be discharged.   Garrett Evans is a 22 y.o., Black, Not Hispanic or Latino ethnicity, ENGLISH speaking male with no known psych hx. Pt presented to the ED voluntarily and was placed under IVC. Per triage note: "Patient ambulatory to triage with steady gait, without difficulty or distress; approx 2" laceration to left FA and also area of abrasion; no active bleeding; pt's "girlfriend" is speaking for pt; when ask how this occurred, girlfriend st "he cut himself with some scissors"; when pt asked for details, does not elaborate, just st "I don't know"   Pt was dressed in scrubs with an unremarkable appearance. Pt was cooperative and moderately anxious throughout the interview. Pt was alert and oriented x5. Pt's speech was clear and coherent. Pt's thought processes were relevant and linear. Pt had good reality testing. Motor behavior appeared normal. Eye contact was poor. Pt's mood was euthymic; affect was responsive. Patient was noted to have good insight, explaining that he'd accidently snagged his arm on open scissors and in no way wants to end his life or self-injure. Pt reported that he has a job and that his life is good enough. The patient did admit to daily cannabis use. The patient denied symptoms of depression/anxiety. Pt reported that he does not have a hx of suicidal attempts, NSSIB, SI, or mental health hospitalizations. Pt also denied HI, and AV/H.   Collateral: Garrett Evans (Girlfriend) [phone]  Pt's girlfriend corroborated the pt's claims about accidently injuring himself. Girlfriend did not have any safety concerns for the pt, explaining that the pt does not have a hx of self-harm or depressed moods. Girlfriend explained that she would  be available to pick pt up if discharged before 5 AM; otherwise she would have to pick pt up after 1pm 06/19/23.    Chief Complaint:  Chief Complaint  Patient presents with   Laceration   Visit Diagnosis: Diagnosis deferred Laceration  Patient Reported Information How did you hear about [place]? No data recorded What Is the Reason for Your Visit/Call Today? No data recorded How Long Has This Been Causing You Problems? No data recorded What Do You Feel Would Help You the Most Today? No data recorded  Have You Recently Had Any Thoughts About Hurting Yourself? No data recorded Are You Planning to Commit Suicide/Harm Yourself At This time? No data recorded  Have you Recently Had Thoughts About Hurting Someone Garrett Evans? No data recorded Are You Planning to Harm Someone at This Time? No data recorded Explanation: No data recorded  Have You Used Any Alcohol or Drugs in the Past 24 Hours? No data recorded How Long Ago Did You Use Drugs or Alcohol? No data recorded What Did You Use and How Much? No data recorded  Do You Currently Have a Therapist/Psychiatrist? No data recorded Name of Therapist/Psychiatrist: No data recorded  Have You Been Recently Discharged From Any Office Practice or Programs? No data recorded Explanation of Discharge From Practice/Program: No data recorded   CCA Screening Triage Referral Assessment Type of Contact: No data recorded Telemedicine Service Delivery:   Is this Initial or Reassessment?   Date Telepsych consult ordered in [facility]:    Time Telepsych consult ordered in [facility]:    Location of Assessment: No data recorded Provider Location: No data recorded   Collateral Involvement: No data recorded  Does  Patient Have a Automotive engineer Guardian? No data recorded Name and Contact of Legal Guardian: No data recorded If Minor and Not Living with Parent(s), Who has Custody? No data recorded Is CPS involved or ever been involved? No data recorded Is APS involved or  ever been involved? No data recorded  Patient Determined To Be At Risk for Harm To Self or Others Based on Review of Patient Reported Information or Presenting Complaint? No data recorded Method: No data recorded Availability of Means: No data recorded Intent: No data recorded Notification Required: No data recorded Additional Information for Danger to Others Potential: No data recorded Additional Comments for Danger to Others Potential: No data recorded Are There Guns or Other Weapons in Your Home? No data recorded Types of Guns/Weapons: No data recorded Are These Weapons Safely Secured?                            No data recorded Who Could Verify You Are Able To Have These Secured: No data recorded Do You Have any Outstanding Charges, Pending Court Dates, Parole/Probation? No data recorded Contacted To Inform of Risk of Harm To Self or Others: No data recorded  Does Patient Present under Involuntary Commitment? No data recorded   [place] of Residence: No data recorded  Patient Currently Receiving the Following Services: No data recorded  Determination of Need: No data recorded  Options For Referral: No data recorded  Discharge Disposition:     Maci Eickholt R Maximos Zayas, LCAS

## 2023-06-19 NOTE — ED Notes (Signed)
Breakfast provided.

## 2023-06-19 NOTE — ED Notes (Signed)
ivc/consult done/pt does not meet criteria for psychiatric inpatient admission

## 2023-06-19 NOTE — ED Notes (Addendum)
Per. Dr. Katrinka Blazing and Dr. Arnoldo Morale, pt needs a psych re-assessment. Pt still under IVC. New consult order placed.

## 2023-06-19 NOTE — ED Notes (Signed)
Moved to [facility]-6

## 2023-06-19 NOTE — ED Provider Notes (Signed)
Emergency Medicine Observation Re-evaluation Note  Garrett Evans is a 22 y.o. male, seen on rounds today.  Pt initially presented to the ED for complaints of Laceration  Currently, the patient is is no acute distress. Denies any concerns at this time.  Physical Exam  Blood pressure 110/64, pulse 72, temperature 98.3 F (36.8 C), temperature source Oral, resp. rate 18, height 5\' 11"  (1.803 m), weight 86.2 kg, SpO2 98%.  Physical Exam: General: No apparent distress Pulm: Normal WOB Neuro: Large wound with sutures in place to the left forearm Psych: Resting comfortably     ED Course / MDM    I have reviewed the labs performed to date as well as medications administered while in observation.  Recent changes in the last 24 hours include: No acute events overnight.  Plan   Current plan: Patient awaiting psychiatric disposition. Psychiatry evaluated the patient today.  Recommended rescinding IVC.  IVC was rescinded.  Patient denies any suicidal ideation.  Discussed when the sutures needed to be removed.  Patient is no longer under full IVC at this time.    Corena Herter, MD 06/19/23 1320

## 2023-06-19 NOTE — ED Notes (Signed)
Pt belongings include tank top, shirt, hoodie, 2 socks, 2 phones, 1 pants, 1 underwear

## 2023-06-19 NOTE — ED Provider Notes (Signed)
[facility] Provider Note    Event Date/Time   First MD Initiated Contact with Patient 06/19/23 0203     (approximate)   History   Laceration   HPI  Garrett Evans is a 22 y.o. male who presents to the ED for evaluation of Laceration   Patient presents to the ED alongside an ex-girlfriend for evaluation of a laceration across his volar left forearm.  History is somewhat limited as he is very reticent.  Tearful and avoiding eye contact.  Reports he was feeling itchy so he scratched himself with a pair of scissors   Physical Exam   Triage Vital Signs: ED Triage Vitals  Encounter Vitals Group     BP 06/19/23 0200 120/74     Systolic BP Percentile --      Diastolic BP Percentile --      Pulse Rate 06/19/23 0200 62     Resp 06/19/23 0200 18     Temp 06/19/23 0200 97.8 F (36.6 C)     Temp Source 06/19/23 0200 Oral     SpO2 06/19/23 0200 100 %     Weight 06/19/23 0154 190 lb (86.2 kg)     Height 06/19/23 0154 5\' 11"  (1.803 m)     Head Circumference --      Peak Flow --      Pain Score 06/19/23 0156 5     Pain Loc --      Pain Education --      Exclude from Growth Chart --     Most recent vital signs: Vitals:   06/19/23 0200  BP: 120/74  Pulse: 62  Resp: 18  Temp: 97.8 F (36.6 C)  SpO2: 100%    General: Awake, no distress.  Tearful and avoiding eye contact, quiet CV:  Good peripheral perfusion.  Resp:  Normal effort.  Abd:  No distention.  MSK:  No deformity noted.  Laceration obliquely oriented across the left forearm, as pictured below.  Crosses over a pre-existing tattoo.  About 10 cm in long and into the subcutaneous tissue.  No visible muscle belly or tendinous structures.  No evidence of tendinous disruption or impaired range of motion to the elbow, wrist, hand or fingers Neuro:  No focal deficits appreciated. Other:     ED Results / Procedures / Treatments   Labs (all labs ordered are listed, but only abnormal results  are displayed) Labs Reviewed - No data to display  EKG   RADIOLOGY   Official radiology report(s): No results found.  PROCEDURES and INTERVENTIONS:  .Marland KitchenLaceration Repair  Date/Time: 06/19/2023 3:03 AM  Performed by: Delton Prairie, MD Authorized by: Delton Prairie, MD   Consent:    Consent obtained:  Verbal   Consent given by:  Patient   Risks, benefits, and alternatives were discussed: yes   Anesthesia:    Anesthesia method:  Local infiltration   Local anesthetic:  Lidocaine 1% w/o epi Laceration details:    Location:  Shoulder/arm   Shoulder/arm location:  L lower arm   Length (cm):  10 Exploration:    Hemostasis achieved with:  Direct pressure   Imaging outcome: foreign body not noted     Contaminated: no   Treatment:    Area cleansed with:  Povidone-iodine   Amount of cleaning:  Standard   Irrigation solution:  Sterile saline   Irrigation volume:  500   Visualized foreign bodies/material removed: no   Skin repair:    Repair method:  Sutures  Suture size:  3-0   Wound skin closure material used: Ethilon.   Suture technique:  Simple interrupted and running locked   Number of sutures: x2 simple interrupted at the "O" of his tattoo, then x7 running locked, x6 running locked on either side. Approximation:    Approximation:  Close Repair type:    Repair type:  Intermediate Post-procedure details:    Dressing:  Open (no dressing)   Procedure completion:  Tolerated well, no immediate complications   Medications  lidocaine (PF) (XYLOCAINE) 1 % injection 10 mL (10 mLs Infiltration Given 06/19/23 0255)     IMPRESSION / MDM / ASSESSMENT AND PLAN / ED COURSE  I reviewed the triage vital signs and the nursing notes.  Differential diagnosis includes, but is not limited to, accidental laceration, intentional self-harm, polysubstance abuse, overdose,  {Patient presents with symptoms of an acute illness or injury that is potentially life-threatening.  Patient presents  with a self-inflicted laceration to the left forearm that is quite concerning for intentional self-harm.  He has a flat and evasive affect, tearful and refuses to make eye contact.  Reticent and refuses to elaborate how it happened.  It is repaired, as above.  He is placed under IVC and psychiatry is consulted as I am quite concerned about his mental health and this is self-inflicted  Clinical Course as of 06/19/23 0316  Fri Jun 19, 2023  0216 Spoke with ex-girlfriend in the hallway [DS]  0252 X2 simple interrupted X7 running locked X6 running locked 3-0 ethilon [DS]    Clinical Course User Index [DS] Delton Prairie, MD     FINAL CLINICAL IMPRESSION(S) / ED DIAGNOSES   Final diagnoses:  Laceration of left forearm, initial encounter     Rx / DC Orders   ED Discharge Orders     None        Note:  This document was prepared using Dragon voice recognition software and may include unintentional dictation errors.   Delton Prairie, MD 06/19/23 [phone]

## 2023-06-19 NOTE — ED Notes (Signed)
Declined shower.  Hospital meal provided.  100% consumed, pt tolerated w/o complaints.  Waste discarded appropriately.

## 2023-06-19 NOTE — Discharge Instructions (Signed)
We placed 15 stitches that will not absorb on their own.  These need to be removed after 7-10 days.  Gently wash the wound with soap and water.  It is okay to shower, but do not submerge in a bath or go swimming as it is healing.  Do not vigorously scrub.   Gently pat dry.   Once dry, then apply Neosporin or bacitracin or even Vaseline ointment to the area to act as a barrier to help prevent infection.

## 2023-07-01 ENCOUNTER — Ambulatory Visit
Admission: EM | Admit: 2023-07-01 | Discharge: 2023-07-01 | Disposition: A | Discharge status: Home / Self Care | Payer: PRIVATE HEALTH INSURANCE

## 2023-07-01 DIAGNOSIS — Z4802 Encounter for removal of sutures: Secondary | ICD-10-CM

## 2023-07-01 NOTE — ED Provider Notes (Signed)
[facility]    CSN: [id number] Arrival date & time: 07/01/23  1201      History   Chief Complaint Chief Complaint  Patient presents with   Suture / Staple Removal    HPI Garrett Evans is a 22 y.o. male.   HPI  22 year old male with no significant past medical history presents with request for suture removal.  He sustained a laceration to his left forearm on 06/19/23.  He was evaluated at [facility] ER and had sutures placed in his left forearm.  He denies any fever or drainage from the wound.  History reviewed. No pertinent past medical history.  Patient Active Problem List   Diagnosis Date Noted   Cut of forearm 06/19/2023    History reviewed. No pertinent surgical history.     Home Medications    Prior to Admission medications   Medication Sig Start Date End Date Taking? Authorizing Provider  naproxen (NAPROSYN) 500 MG tablet Take 1 tablet (500 mg total) by mouth 2 (two) times daily with a meal. Patient not taking: Reported on 06/19/2023 12/27/20   Joni Reining, PA-C  orphenadrine (NORFLEX) 100 MG tablet Take 1 tablet (100 mg total) by mouth 2 (two) times daily. Patient not taking: Reported on 06/19/2023 12/27/20   Joni Reining, PA-C  triamcinolone (KENALOG) 0.1 % Apply very thin coat of cream to bumps twice a day Patient not taking: Reported on 06/19/2023 11/04/20   Tommi Rumps, PA-C    Family History History reviewed. No pertinent family history.  Social History Social History   Tobacco Use   Smoking status: Every Day   Smokeless tobacco: Never  Substance Use Topics   Alcohol use: Never   Drug use: Never     Allergies   Patient has no known allergies.   Review of Systems Review of Systems  Constitutional:  Negative for fever.  Skin:  Positive for wound. Negative for color change.     Physical Exam Triage Vital Signs ED Triage Vitals  Encounter Vitals Group     BP --      Systolic BP Percentile --      Diastolic BP  Percentile --      Pulse --      Resp 07/01/23 1235 16     Temp --      Temp Source 07/01/23 1235 Oral     SpO2 --      Weight 07/01/23 1234 190 lb (86.2 kg)     Height 07/01/23 1234 5\' 11"  (1.803 m)     Head Circumference --      Peak Flow --      Pain Score --      Pain Loc --      Pain Education --      Exclude from Growth Chart --    No data found.  Updated Vital Signs BP 107/71 (BP Location: Left Arm)   Pulse 75   Temp 98.1 F (36.7 C) (Oral)   Resp 16   Ht 5\' 11"  (1.803 m)   Wt 190 lb (86.2 kg)   SpO2 100%   BMI 26.50 kg/m   Visual Acuity Right Eye Distance:   Left Eye Distance:   Bilateral Distance:    Right Eye Near:   Left Eye Near:    Bilateral Near:     Physical Exam Vitals and nursing note reviewed.  Constitutional:      Appearance: Normal appearance. He is not ill-appearing.  HENT:  Head: Normocephalic and atraumatic.  Skin:    General: Skin is warm.     Capillary Refill: Capillary refill takes less than 2 seconds.     Findings: No bruising or erythema.  Neurological:     General: No focal deficit present.     Mental Status: He is alert and oriented to person, place, and time.      UC Treatments / Results  Labs (all labs ordered are listed, but only abnormal results are displayed) Labs Reviewed - No data to display  EKG   Radiology No results found.  Procedures Procedures (including critical care time)  Medications Ordered in UC Medications - No data to display  Initial Impression / Assessment and Plan / UC Course  I have reviewed the triage vital signs and the nursing notes.  Pertinent labs & imaging results that were available during my care of the patient were reviewed by me and considered in my medical decision making (see chart for details).   Patient is a pleasant, nontoxic-appearing 22 year old male presenting for suture removal from a laceration he sustained to his left forearm 12 days ago.  As you can see in image  above, the wound is clean, dry, and intact.  There are some mild erythema around the sutures but the wound is not hot and there is no drainage.  No concern for possible infection.  There are 2 simple interrupted sutures on either side of the oh and running sutures on either side of the elbow to close the laceration.  I have asked the staff to remove the sutures and we will discharge the patient home.  He denies need for work note.   Final Clinical Impressions(s) / UC Diagnoses   Final diagnoses:  Encounter for removal of sutures     Discharge Instructions      Keep the area clean and dry.  You may want to keep a dressing in place until the holes from the sutures have healed, typically 1 to 2 days.  You may apply topical antibiotic ointment if you wish.  If you develop any redness, swelling, drainage, or fever please return for reevaluation.     ED Prescriptions   None    PDMP not reviewed this encounter.   Becky Augusta, NP 07/01/23 1313

## 2023-07-01 NOTE — Discharge Instructions (Addendum)
Keep the area clean and dry.  You may want to keep a dressing in place until the holes from the sutures have healed, typically 1 to 2 days.  You may apply topical antibiotic ointment if you wish.  If you develop any redness, swelling, drainage, or fever please return for reevaluation.

## 2023-07-01 NOTE — ED Triage Notes (Addendum)
Pt presents to UC for stitch removal that were placed in L FA. Had 15 stiches placed at [facility] ED.

## 2023-07-02 ENCOUNTER — Ambulatory Visit: Payer: PRIVATE HEALTH INSURANCE
